# Patient Record
Sex: Male | Born: 2000
Health system: Southern US, Community
[De-identification: ages and names within clinical notes are randomized; demographics above are authoritative.]

## PROBLEM LIST (undated history)

## (undated) DIAGNOSIS — Z8774 Personal history of (corrected) congenital malformations of heart and circulatory system: Secondary | ICD-10-CM

## (undated) DIAGNOSIS — I370 Nonrheumatic pulmonary valve stenosis: Secondary | ICD-10-CM

## (undated) DIAGNOSIS — G935 Compression of brain: Secondary | ICD-10-CM

## (undated) DIAGNOSIS — E230 Hypopituitarism: Secondary | ICD-10-CM

## (undated) HISTORY — PX: CIRCUMCISION: SUR203

## (undated) HISTORY — DX: Hypopituitarism: E23.0

## (undated) HISTORY — DX: Nonrheumatic pulmonary valve stenosis: I37.0

## (undated) HISTORY — DX: Personal history of (corrected) congenital malformations of heart and circulatory system: Z87.74

## (undated) HISTORY — DX: Compression of brain: G93.5

---
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
## 2020-01-13 ENCOUNTER — Ambulatory Visit: Payer: Self-pay | Admitting: Cardiology

## 2020-01-19 NOTE — Progress Notes (Signed)
Earl Hartshorn, MD Reason for referral-pulmonic stenosis, prior ASD closure and PDA closure  HPI: 19 year old male for evaluation of pulmonic stenosis, prior ASD and PDA closure.  Last echocardiogram performed at [facility] in 2017 showed normal LV function, mild pulmonic stenosis, mild pulmonic insufficiency, normal RV size and systolic function.  Patient denies dyspnea, chest pain, palpitations or syncope.  Current Outpatient Medications  Medication Sig Dispense Refill  . fexofenadine (ALLEGRA) 30 MG tablet Take 30 mg by mouth 2 (two) times daily.    Marland Kitchen levothyroxine (SYNTHROID) 25 MCG tablet TAKE 1 TABLET (25 MCG TOTAL) BY MOUTH DAILY. 90 tablet 1   No current facility-administered medications for this visit.    No Known Allergies   Past Medical History:  Diagnosis Date  . [name]-Chiari malformation, type I (H[facility])   . Growth hormone deficiency ([facility])   . Premature infant   . Pulmonary stenosis   . Spontaneous ASD closure   . Spontaneous PDA closure     Past Surgical History:  Procedure Laterality Date  . CIRCUMCISION      Social History   Socioeconomic History  . Marital status: Single    Spouse name: Not on file  . Number of children: Not on file  . Years of education: Not on file  . Highest education level: Not on file  Occupational History  . Not on file  Tobacco Use  . Smoking status: Never Smoker  . Smokeless tobacco: Never Used  Substance and Sexual Activity  . Alcohol use: Not on file  . Drug use: Not on file  . Sexual activity: Not on file  Other Topics Concern  . Not on file  Social History Narrative   Lives with parents, sister and twin brother   Social Determinants of Health   Financial Resource Strain:   . Difficulty of Paying Living Expenses:   Food Insecurity:   . Worried About Programme researcher, broadcasting/film/video in the Last Year:   . Barista in the Last Year:   Transportation Needs:   . Freight forwarder (Medical):   Marland Kitchen Lack of  Transportation (Non-Medical):   Physical Activity:   . Days of Exercise per Week:   . Minutes of Exercise per Session:   Stress:   . Feeling of Stress :   Social Connections:   . Frequency of Communication with Friends and Family:   . Frequency of Social Gatherings with Friends and Family:   . Attends Religious Services:   . Active Member of Clubs or Organizations:   . Attends Banker Meetings:   Marland Kitchen Marital Status:   Intimate Partner Violence:   . Fear of Current or Ex-Partner:   . Emotionally Abused:   Marland Kitchen Physically Abused:   . Sexually Abused:     Family History  Problem Relation Age of Onset  . Hypertension Paternal Grandfather   . Hypertension Maternal Grandmother   . Growth hormone deficiency Brother     ROS: no fevers or chills, productive cough, hemoptysis, dysphasia, odynophagia, melena, hematochezia, dysuria, hematuria, rash, seizure activity, orthopnea, PND, pedal edema, claudication. Remaining systems are negative.  Physical Exam:   Blood pressure 102/74, pulse 86, height 5\' 8"  (1.727 m), weight 145 lb 1.9 oz (65.8 kg), SpO2 99 %.  General:  Well developed/well nourished in NAD Skin warm/dry Patient not depressed No peripheral clubbing Back-normal HEENT-normal/normal eyelids Neck supple/normal carotid upstroke bilaterally; no bruits; no JVD; no thyromegaly chest - CTA/ normal expansion CV - RRR/normal  S1 and S2; 2/6 systolic murmur RSB; no rubs or gallops;  PMI nondisplaced Abdomen -NT/ND, no HSM, no mass, + bowel sounds, no bruit 2+ femoral pulses, no bruits Ext-no edema, chords, 2+ DP Neuro-grossly nonfocal  ECG -rate of 86, nonspecific ST changes. Personally reviewed  A/P  1 history of pulmonic stenosis-we will plan to repeat echocardiogram.  Patient is not having symptoms.  2 ? history of ASD and PDA spontaneous closure-we will repeat echocardiogram.  Olga Millers, MD

## 2020-01-27 ENCOUNTER — Other Ambulatory Visit: Payer: Self-pay

## 2020-01-27 ENCOUNTER — Encounter: Payer: Self-pay | Admitting: Cardiology

## 2020-01-27 ENCOUNTER — Ambulatory Visit (INDEPENDENT_AMBULATORY_CARE_PROVIDER_SITE_OTHER): Payer: 59 | Admitting: Cardiology

## 2020-01-27 VITALS — BP 102/74 | HR 86 | Ht 68.0 in | Wt 145.1 lb

## 2020-01-27 DIAGNOSIS — Q211 Atrial septal defect, unspecified: Secondary | ICD-10-CM

## 2020-01-27 DIAGNOSIS — Q250 Patent ductus arteriosus: Secondary | ICD-10-CM

## 2020-01-27 DIAGNOSIS — Q223 Other congenital malformations of pulmonary valve: Secondary | ICD-10-CM

## 2020-01-27 NOTE — Patient Instructions (Signed)
Medication Instructions:   NO CHANGE  *If you need a refill on your cardiac medications before your next appointment, please call your pharmacy*   Lab Work: If you have labs (blood work) drawn today and your tests are completely normal, you will receive your results only by: Marland Kitchen MyChart Message (if you have MyChart) OR . A paper copy in the mail If you have any lab test that is abnormal or we need to change your treatment, we will call you to review the results.   TESTING:  Your physician has requested that you have an echocardiogram. Echocardiography is a painless test that uses sound waves to create images of your heart. It provides your doctor with information about the size and shape of your heart and how well your heart's chambers and valves are working. This procedure takes approximately one hour. There are no restrictions for this procedure.[address]   Follow-Up: At [facility], you and your health needs are our priority.  As part of our continuing mission to provide you with exceptional heart care, we have created designated Provider Care Teams.  These Care Teams include your primary Cardiologist (physician) and Advanced Practice Providers (APPs -  Physician Assistants and Nurse Practitioners) who all work together to provide you with the care you need, when you need it.  We recommend signing up for the patient portal called "MyChart".  Sign up information is provided on this After Visit Summary.  MyChart is used to connect with patients for Virtual Visits (Telemedicine).  Patients are able to view lab/test results, encounter notes, upcoming appointments, etc.  Non-urgent messages can be sent to your provider as well.   To learn more about what you can do with MyChart, go to [email or web address].    Your next appointment:   12 month(s)  The format for your next appointment:   In Person  Provider:   Olga Millers, MD

## 2020-02-02 NOTE — Addendum Note (Signed)
Addended by: Myna Hidalgo A on: 02/02/2020 12:53 PM   Modules accepted: Orders

## 2020-02-15 ENCOUNTER — Telehealth (INDEPENDENT_AMBULATORY_CARE_PROVIDER_SITE_OTHER): Payer: Self-pay | Admitting: Family

## 2020-02-15 ENCOUNTER — Telehealth (HOSPITAL_COMMUNITY): Payer: Self-pay | Admitting: Cardiology

## 2020-02-15 ENCOUNTER — Ambulatory Visit: Payer: Self-pay | Admitting: Cardiology

## 2020-02-15 ENCOUNTER — Other Ambulatory Visit (HOSPITAL_COMMUNITY): Payer: 59

## 2020-02-15 DIAGNOSIS — E039 Hypothyroidism, unspecified: Secondary | ICD-10-CM

## 2020-02-15 NOTE — Telephone Encounter (Signed)
Who's calling (name and relationship to patient) : °Leslie (mom) ° °Best contact number: °[phone] ° °Provider they see: °Spenser Beasley ° °Reason for call: °Patient is scheduled to be seen next Monday, 8/2 & mom would like orders for lab work to be sent to [facility] in [place] so labs can be completed prior to appointment. ° ° ° °PRESCRIPTION REFILL ONLY ° °Name of prescription: ° °Pharmacy: ° °

## 2020-02-15 NOTE — Telephone Encounter (Signed)
Per Order Dr Jens Som wanted Erie Noe to do echocardiogram and appointment was made on her day off. I called patient and Left Voicemail that appt was cancelled for 02/15/2020 and we would call him on 02/16/2020 with another date and time when Erie Noe can scan patient.

## 2020-02-15 NOTE — Telephone Encounter (Signed)
Spoke with patient and let him know labs were sent to [facility] as requested.

## 2020-02-18 DIAGNOSIS — E039 Hypothyroidism, unspecified: Secondary | ICD-10-CM | POA: Diagnosis not present

## 2020-02-19 LAB — T4, FREE: Free T4: 1.43 ng/dL (ref 0.93–1.60)

## 2020-02-19 LAB — TSH: TSH: 4.29 u[IU]/mL (ref 0.450–4.500)

## 2020-02-19 LAB — T4: T4, Total: 6.5 ug/dL (ref 4.5–12.0)

## 2020-02-22 ENCOUNTER — Encounter (INDEPENDENT_AMBULATORY_CARE_PROVIDER_SITE_OTHER): Payer: Self-pay | Admitting: Family

## 2020-02-22 ENCOUNTER — Ambulatory Visit (INDEPENDENT_AMBULATORY_CARE_PROVIDER_SITE_OTHER): Payer: 59 | Admitting: Family

## 2020-02-22 ENCOUNTER — Other Ambulatory Visit: Payer: Self-pay

## 2020-02-22 VITALS — BP 116/68 | HR 72 | Wt 148.8 lb

## 2020-02-22 DIAGNOSIS — E039 Hypothyroidism, unspecified: Secondary | ICD-10-CM | POA: Diagnosis not present

## 2020-02-22 NOTE — Progress Notes (Signed)
Subjective:  Subjective   Patient Name: Earl [name] Date of Birth: March 24, 2001  MRN: [id number]  Earl [name]  presents to the office today for follow-up evaluation and management of his growth hormone deficiency treated with growth hormone  HISTORY OF PRESENT ILLNESS:   Earl [name] is a 19 y.o. Caucasian male   Stefon was accompanied by his dad and brother    1. 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 His initial visit was 02/21/05. His height was <1 %ile with weight at 1st %ile. IGF-1 was 16 ng/dL (58-592) and IGF-BP3 was 1.5 mg/L (0.8-3.0). 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 He was started on [facility] 03/2006. He was started on Norditropin with good results.  He has tolerated it well. He has been being followed at [facility]. His family is transitioning care to our clinic today. His last visit at [facility] was in May 2014. At that time he was receiving Norditropin 1.5 mg daily 6 days per week (0.24 mg/kg/week).    2. The patient's last [facility] visit was on 08/2019 . In the interim, he has been generally healthy.    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 He is mainly playing golf for activity. Taking 25 mcg of levothyroxine per day, rarely misses a dose. Denies fatigue, constipation and cold intolerance.   3. Pertinent Review of Systems:  All systems reviewed with pertinent positives listed below; otherwise negative. Constitutional: Sleeping well. Good energy and appetite. Weight stable.  Eyes: no blurry vision, no changes in vision.  HENT: No neck pain, no trouble swallowing.  Respiratory: No increased work of breathing. No SOB  Cardiac: No palpitations. No chest pain  GI: No constipation or diarrhea GU: No polyuria or nocturia Musculoskeletal: No joint deformity Neuro: Normal affect. No tremors or motor changes.  Endocrine: As above     PAST MEDICAL, FAMILY, AND SOCIAL HISTORY  Past  Medical History:  Diagnosis Date  . [name]-Chiari malformation, type I ([facility])   . Growth hormone deficiency ([facility])   . Premature infant   . Pulmonary stenosis   . Spontaneous ASD closure   . Spontaneous PDA closure     Family History  Problem Relation Age of Onset  . Hypertension Paternal Grandfather   . Hypertension Maternal Grandmother   . Growth hormone deficiency Brother      Current Outpatient Medications:  .  fexofenadine (ALLEGRA) 30 MG tablet, Take 30 mg by mouth 2 (two) times daily., Disp: , Rfl:  .  levothyroxine (SYNTHROID) 25 MCG tablet, TAKE 1 TABLET (25 MCG TOTAL) BY MOUTH DAILY., Disp: 90 tablet, Rfl: 1  Allergies as of 02/22/2020  . (No Known Allergies)     reports that he has never smoked. He has never used smokeless tobacco. Pediatric History  Patient Parents  . Earl [name] (Mother)  . Earl [name] (Father)   Other Topics Concern  . Not on file  Social History Narrative   Lives with parents, sister and twin brother   Finished indoor track. Now wants to play golf.    Primary Care Provider: [name], MD  ROS: There are no other significant problems involving Earl [name]'s other body systems.    Objective:  Objective  Vital Signs:  BP 116/68   Pulse 72   Wt 148 lb 12.8 oz (67.5 kg)   BMI 22.62 kg/m   Blood pressure percentiles are not available for patients who are 18 years or older.  Ht Readings from Last 3 Encounters:  01/27/20 5\' 8"  (1.727 m) (29 %, Z= -0.55)*  07/14/18 5' 7.52" (1.715 m) (27 %, Z= -0.62)*  03/11/18 5' 7.48" (1.714 m) (28 %, Z= -0.59)*   * Growth percentiles are based on CDC (Boys, 2-20 Years) data.   Wt Readings from Last 3 Encounters:  02/22/20 148 lb 12.8 oz (67.5 kg) (42 %, Z= -0.20)*  01/27/20 145 lb 1.9 oz (65.8 kg) (36 %, Z= -0.35)*  07/14/18 132 lb 3.2 oz (60 kg) (25 %, Z= -0.68)*   * Growth percentiles are based on CDC (Boys, 2-20 Years) data.   HC Readings from Last 3 Encounters:  No data found for  [facility]   Body surface area is 1.8 meters squared. No height on file for this encounter. 42 %ile (Z= -0.20) based on CDC (Boys, 2-20 Years) weight-for-age data using vitals from 02/22/2020.    PHYSICAL EXAM:  General: Well developed, well nourished male in no acute distress.   Head: Normocephalic, atraumatic.   Eyes:  Pupils equal and round. EOMI.  Sclera white.  No eye drainage.   Ears/Nose/Mouth/Throat: Nares patent, no nasal drainage.  Normal dentition, mucous membranes moist.  Neck: supple, no cervical lymphadenopathy, no thyromegaly Cardiovascular: regular rate, normal S1/S2, no murmurs Respiratory: No increased work of breathing.  Lungs clear to auscultation bilaterally.  No wheezes. Abdomen: soft, nontender, nondistended. Normal bowel sounds.  No appreciable masses  Extremities: warm, well perfused, cap refill < 2 sec.   Musculoskeletal: Normal muscle mass.  Normal strength Skin: warm, dry.  No rash or lesions. Neurologic: alert and oriented, normal speech, no tremor  LAB DATA:         Assessment and Plan:  Assessment  ASSESSMENT: Jatavis is a 19 y.o. Caucasian twin with growth hormone deficiency, he was on growth hormone supplement until 02/2017. 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   He is clinically and biochemically euthyroid on 25 mcg of levothyroxine per day.   PLAN:  1. Diagnostic: Reviewed labs  2. Therapeutic: Take 25 mcg of levothyroxine per day  3. Patient education: Discussed all of the above in detail.  4. Follow-up: 6 months       03/2017,  [facility]  Pediatric Specialist  [address]  [place], [place]  Tele: [phone]

## 2020-02-23 ENCOUNTER — Other Ambulatory Visit (INDEPENDENT_AMBULATORY_CARE_PROVIDER_SITE_OTHER): Payer: Self-pay | Admitting: Family

## 2020-02-23 DIAGNOSIS — E039 Hypothyroidism, unspecified: Secondary | ICD-10-CM

## 2020-02-23 MED FILL — LEVOTHYROXINE SODIUM 25 MCG: 25 | 90 days supply | Qty: 90 | Fill #0

## 2020-02-25 ENCOUNTER — Ambulatory Visit (INDEPENDENT_AMBULATORY_CARE_PROVIDER_SITE_OTHER): Payer: 59 | Admitting: Family

## 2020-02-26 ENCOUNTER — Other Ambulatory Visit: Payer: Self-pay

## 2020-02-26 ENCOUNTER — Ambulatory Visit (HOSPITAL_COMMUNITY): Payer: 59 | Attending: Cardiovascular Disease

## 2020-02-26 DIAGNOSIS — Q223 Other congenital malformations of pulmonary valve: Secondary | ICD-10-CM | POA: Diagnosis not present

## 2020-02-26 LAB — ECHOCARDIOGRAM COMPLETE
Area-P 1/2: 3.48 cm2
S' Lateral: 3.1 cm

## 2020-02-29 ENCOUNTER — Other Ambulatory Visit (HOSPITAL_COMMUNITY): Payer: Self-pay

## 2020-04-25 DIAGNOSIS — Z20822 Contact with and (suspected) exposure to covid-19: Secondary | ICD-10-CM | POA: Diagnosis not present

## 2020-04-25 DIAGNOSIS — J3489 Other specified disorders of nose and nasal sinuses: Secondary | ICD-10-CM | POA: Diagnosis not present

## 2020-04-25 DIAGNOSIS — J069 Acute upper respiratory infection, unspecified: Secondary | ICD-10-CM | POA: Diagnosis not present

## 2020-04-25 DIAGNOSIS — R059 Cough, unspecified: Secondary | ICD-10-CM | POA: Diagnosis not present

## 2020-04-25 DIAGNOSIS — R0989 Other specified symptoms and signs involving the circulatory and respiratory systems: Secondary | ICD-10-CM | POA: Diagnosis not present

## 2020-04-25 DIAGNOSIS — R0600 Dyspnea, unspecified: Secondary | ICD-10-CM | POA: Diagnosis not present

## 2020-04-25 DIAGNOSIS — R0981 Nasal congestion: Secondary | ICD-10-CM | POA: Diagnosis not present

## 2020-04-25 DIAGNOSIS — R067 Sneezing: Secondary | ICD-10-CM | POA: Diagnosis not present

## 2020-05-25 MED FILL — LEVOTHYROXINE SODIUM 25 MCG: 25 | 90 days supply | Qty: 90 | Fill #1

## 2020-08-24 ENCOUNTER — Other Ambulatory Visit (INDEPENDENT_AMBULATORY_CARE_PROVIDER_SITE_OTHER): Payer: Self-pay | Admitting: Family

## 2020-08-24 DIAGNOSIS — E039 Hypothyroidism, unspecified: Secondary | ICD-10-CM

## 2020-08-24 MED FILL — LEVOTHYROXINE SODIUM 25 MCG: 25 | 90 days supply | Qty: 90 | Fill #0

## 2020-08-29 ENCOUNTER — Telehealth (INDEPENDENT_AMBULATORY_CARE_PROVIDER_SITE_OTHER): Payer: 59 | Admitting: Family

## 2020-08-29 ENCOUNTER — Other Ambulatory Visit: Payer: Self-pay

## 2020-08-29 ENCOUNTER — Encounter (INDEPENDENT_AMBULATORY_CARE_PROVIDER_SITE_OTHER): Payer: Self-pay | Admitting: Family

## 2020-08-29 DIAGNOSIS — E039 Hypothyroidism, unspecified: Secondary | ICD-10-CM | POA: Diagnosis not present

## 2020-08-29 NOTE — Progress Notes (Signed)
This is a Pediatric Specialist E-Visit follow up consult provided via MyChart video chat Earl Earl [name] consented to an E-Visit consult today.  Location of patient: Earl Earl [name] is at school. Location of provider: Gretchen Short ,NP is at office Patient was referred by Earl Lopes, MD   The following participants were involved in this E-Visit: [name] (patient), [name] CMA, [name] , NP(list of participants and their roles)  Chief Complain/ Reason for E-Visit today: Hypothyroidism  Total time on call: >20 spent today reviewing the medical chart, counseling the patient/family, and documenting today's visit.   Follow up: 6 months.     Subjective:  Subjective   Patient Name: Earl Earl [name] Date of Birth: 11-04-2000  MRN: [id number]  [name]  presents to the office today for follow-up evaluation and management of his growth hormone deficiency treated with growth hormone  HISTORY OF PRESENT ILLNESS:   Earl Earl [name] is a 20 y.o. Caucasian male   Earl [name] was accompanied by his dad and brother    1. 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 His initial visit was 02/21/05. His height was <1 %ile with weight at 1st %ile. IGF-1 was 16 ng/dL (91-638) and IGF-BP3 was 1.5 mg/L (0.8-3.0). 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 He was started on [facility] 03/2006. He was started on Norditropin with good results.  He has tolerated it well. He has been being followed at [facility]. His family is transitioning care to our clinic today. His last visit at [facility] was in May 2014. At that time he was receiving Norditropin 1.5 mg daily 6 days per week (0.24 mg/kg/week).    2. The patient's last [facility] visit was on 01/2020 . In the interim, he has been generally healthy.    [name] well in school at [employer]. He is currently playing on the club volleyball team for activity. He is taking 25 mcg of levothyroxine per day, denies missed doses.  Denies fatigue, constipation and cold intolerance.    3. Pertinent Review of Systems:  All systems reviewed with pertinent positives listed below; otherwise negative. Constitutional: Sleeping well. Good energy and appetite. Weight stable.  Eyes: no blurry vision, no changes in vision.  HENT: No neck pain, no trouble swallowing.  Respiratory: No increased work of breathing. No SOB  Cardiac: No palpitations. No chest pain  GI: No constipation or diarrhea GU: No polyuria or nocturia Musculoskeletal: No joint deformity Neuro: Normal affect. No tremors or motor changes.  Endocrine: As above     PAST MEDICAL, FAMILY, AND SOCIAL HISTORY  Past Medical History:  Diagnosis Date  . Earl [name]-Chiari malformation, type I (HCC)   . Growth hormone deficiency (HCC)   . Premature infant   . Pulmonary stenosis   . Spontaneous ASD closure   . Spontaneous PDA closure     Family History  Problem Relation Age of Onset  . Hypertension Paternal Grandfather   . Hypertension Maternal Grandmother   . Growth hormone deficiency Brother      Current Outpatient Medications:  .  fexofenadine (ALLEGRA) 30 MG tablet, Take 30 mg by mouth 2 (two) times daily., Disp: , Rfl:  .  levothyroxine (SYNTHROID) 25 MCG tablet, TAKE 1 TABLET BY MOUTH ONCE DAILY, Disp: 90 tablet, Rfl: 1  Allergies as of 08/29/2020  . (No Known Allergies)     reports that he has never smoked. He has never used smokeless tobacco. Pediatric History  Patient Parents  . Brunet,Leslie (Mother)  .  Sligh,Erik (Father)   Other Topics Concern  . Not on file  Social History Narrative   Lives with parents, sister and twin brother   Finished indoor track. Now wants to play golf.    Primary Care Provider: [name], MD  ROS: There are no other significant problems involving Earl [name]'s other body systems.    Objective:  Objective  Vital Signs:  There were no vitals taken for this visit.  Blood pressure percentiles are not  available for patients who are 18 years or older.  Ht Readings from Last 3 Encounters:  01/27/20 5\' 8"  (1.727 m) (29 %, Z= -0.55)*  07/14/18 5' 7.52" (1.715 m) (27 %, Z= -0.62)*  03/11/18 5' 7.48" (1.714 m) (28 %, Z= -0.59)*   * Growth percentiles are based on CDC (Boys, 2-20 Years) data.   Wt Readings from Last 3 Encounters:  02/22/20 148 lb 12.8 oz (67.5 kg) (42 %, Z= -0.20)*  01/27/20 145 lb 1.9 oz (65.8 kg) (36 %, Z= -0.35)*  07/14/18 132 lb 3.2 oz (60 kg) (25 %, Z= -0.68)*   * Growth percentiles are based on CDC (Boys, 2-20 Years) data.   HC Readings from Last 3 Encounters:  No data found for [facility]   There is no height or weight on file to calculate BSA. No height on file for this encounter. No weight on file for this encounter.    PHYSICAL EXAM: General: Well developed, well nourished male in no acute distress.   Head: Normocephalic, atraumatic.   Eyes:  Pupils equal and round. EOMI.  Sclera white.  No eye drainage.   Cardiovascular: No cyanosis.  Respiratory: No increased work of breathing. Skin: warm, dry.  No rash or lesions. Neurologic: alert and oriented, normal speech, no tremor   LAB DATA:         Assessment and Plan:  Assessment  ASSESSMENT: Eros is a 20 y.o. Caucasian twin with growth hormone deficiency and has completed linear growth. He also has hypothyroidism on daily levothyroxine.   He is clinically euthyroid on 25 mcg of levothyroxine per day.   PLAN:  1. Diagnostic: TSH, FT4 and T4 ordered   2. Therapeutic: Take 25 mcg of levothyroxine per day  3. Patient education: Reviewed s/s of hypothyroidism. Advised to take medication on empty stomach each morning. Answered questions.  4. Follow-up: 6 months       12,  [facility]  Pediatric Specialist  [address]  [place], [place]  Tele: [phone]

## 2020-09-02 DIAGNOSIS — F5101 Primary insomnia: Secondary | ICD-10-CM | POA: Diagnosis not present

## 2020-09-02 DIAGNOSIS — Z13220 Encounter for screening for lipoid disorders: Secondary | ICD-10-CM | POA: Diagnosis not present

## 2020-09-02 DIAGNOSIS — I370 Nonrheumatic pulmonary valve stenosis: Secondary | ICD-10-CM | POA: Diagnosis not present

## 2020-09-02 DIAGNOSIS — Z0000 Encounter for general adult medical examination without abnormal findings: Secondary | ICD-10-CM | POA: Diagnosis not present

## 2020-09-02 DIAGNOSIS — E039 Hypothyroidism, unspecified: Secondary | ICD-10-CM | POA: Diagnosis not present

## 2020-11-27 MED FILL — Levothyroxine Sodium Tab 25 MCG: ORAL | 90 days supply | Qty: 90 | Fill #0 | Status: AC

## 2020-11-28 ENCOUNTER — Other Ambulatory Visit (HOSPITAL_BASED_OUTPATIENT_CLINIC_OR_DEPARTMENT_OTHER): Payer: Self-pay

## 2021-01-31 DIAGNOSIS — H52223 Regular astigmatism, bilateral: Secondary | ICD-10-CM | POA: Diagnosis not present

## 2021-01-31 DIAGNOSIS — H5213 Myopia, bilateral: Secondary | ICD-10-CM | POA: Diagnosis not present

## 2021-02-20 ENCOUNTER — Other Ambulatory Visit: Payer: Self-pay

## 2021-02-20 ENCOUNTER — Ambulatory Visit (INDEPENDENT_AMBULATORY_CARE_PROVIDER_SITE_OTHER): Payer: 59 | Admitting: Family

## 2021-02-20 ENCOUNTER — Encounter (INDEPENDENT_AMBULATORY_CARE_PROVIDER_SITE_OTHER): Payer: Self-pay | Admitting: Family

## 2021-02-20 VITALS — BP 118/76 | HR 80 | Wt 152.6 lb

## 2021-02-20 DIAGNOSIS — E039 Hypothyroidism, unspecified: Secondary | ICD-10-CM | POA: Diagnosis not present

## 2021-02-20 DIAGNOSIS — F5101 Primary insomnia: Secondary | ICD-10-CM | POA: Insufficient documentation

## 2021-02-20 NOTE — Patient Instructions (Signed)
-  Take your medication at the same time every day -Try to take it on an empty stomach -If you forget to take a dose, take it as soon as you remember.  If you don't remember until the next day, take 2 doses then.  NEVER take more than 2 doses at a time. -Use a pill box to help make it easier to keep track of doses   

## 2021-02-20 NOTE — Progress Notes (Signed)
Subjective:  Subjective   Patient Name: Earl Earl Date of Birth: [birth year]  MRN: [id number]  Earl Earl  presents to the office today for follow-up evaluation and management of his growth hormone deficiency treated with growth hormone  HISTORY OF PRESENT ILLNESS:   Earl Earl is a 20 y.o. Caucasian male   Earl Earl was accompanied by his dad and brother    1. 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 His initial visit was 02/21/05. His height was <1 %ile with weight at 1st %ile. IGF-1 was 16 ng/dL (58-850) and IGF-BP3 was 1.5 mg/L (0.8-3.0). 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 He was started on [facility] 03/2006. He was started on Norditropin with good results.  He has tolerated it well. He has been being followed at [facility]. His family is transitioning care to our clinic today. His last visit at [facility] was in May 2014. At that time he was receiving Norditropin 1.5 mg daily 6 days per week (0.24 mg/kg/week).    2. The patient's last [facility] visit was on 01/2020 . In the interim, he has been generally healthy.    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 Will start back to school in August and will live in apartment. He is taking 25 mcg of levothyroxine per day, no missed doses.    3. Pertinent Review of Systems:  All systems reviewed with pertinent positives listed below; otherwise negative. Constitutional: Sleeping well and weight stable.  Eyes: no blurry vision, no changes in vision.  HENT: No neck pain, no trouble swallowing.  Respiratory: No increased work of breathing. No SOB  Cardiac: No palpitations. No chest pain  GI: No constipation or diarrhea GU: No polyuria or nocturia Musculoskeletal: No joint deformity Neuro: Normal affect. No tremors or motor changes.  Endocrine: As above     PAST MEDICAL, FAMILY, AND SOCIAL HISTORY  Past Medical History:  Diagnosis Date    Earl Earl, Earl I (HCC)    Growth hormone deficiency (HCC)    Premature infant    Pulmonary stenosis    Spontaneous ASD closure    Spontaneous PDA closure     Family History  Problem Relation Age of Onset   Hypertension Paternal Grandfather    Hypertension Maternal Grandmother    Growth hormone deficiency Brother      Current Outpatient Medications:    fexofenadine (ALLEGRA) 30 MG tablet, Take 30 mg by mouth 2 (two) times daily., Disp: , Rfl:    levothyroxine (SYNTHROID) 25 MCG tablet, TAKE 1 TABLET BY MOUTH ONCE DAILY, Disp: 90 tablet, Rfl: 1  Allergies as of 02/20/2021   (No Known Allergies)     reports that he has never smoked. He has never used smokeless tobacco. Pediatric History  Patient Parents   Fung,Leslie (Mother)   Andel,Erik (Father)   Other Topics Concern   Not on file  Social History Narrative   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    Finished indoor track. Now wants to play golf.    Primary Care Provider: Daisy Floro, MD  ROS: There are no other significant problems involving Earl Earl other body systems.    Objective:  Objective  Vital Signs:  BP 118/76   Pulse 80   Wt 152 lb 9.6 oz (69.2 kg)   BMI 23.20 kg/m   Growth percentile SmartLinks can only be used for patients less than 70 years old.  Ht  Readings from Last 3 Encounters:  01/27/20 5\' 8"  (1.727 m) (29 %, Z= -0.55)*  07/14/18 5' 7.52" (1.715 m) (27 %, Z= -0.62)*  03/11/18 5' 7.48" (1.714 m) (28 %, Z= -0.59)*   * Growth percentiles are based on CDC (Boys, 2-20 Years) data.   Wt Readings from Last 3 Encounters:  02/20/21 152 lb 9.6 oz (69.2 kg)  02/22/20 148 lb 12.8 oz (67.5 kg) (42 %, Z= -0.20)*  01/27/20 145 lb 1.9 oz (65.8 kg) (36 %, Z= -0.35)*   * Growth percentiles are based on CDC (Boys, 2-20 Years) data.   HC Readings from Last 3 Encounters:  No data found for [facility]   Body surface area is 1.82 meters squared. Facility age limit for  growth percentiles is 20 years. Facility age limit for growth percentiles is 20 years.    PHYSICAL EXAM: General: Well developed, well nourished male in no acute distress.   Head: Normocephalic, atraumatic.   Eyes:  Pupils equal and round. EOMI.  Sclera white.  No eye drainage.   Ears/Nose/Mouth/Throat: Nares patent, no nasal drainage.  Normal dentition, mucous membranes moist.  Neck: supple, no cervical lymphadenopathy, no thyromegaly Cardiovascular: regular rate, normal S1/S2, no murmurs Respiratory: No increased work of breathing.  Lungs clear to auscultation bilaterally.  No wheezes. Abdomen: soft, nontender, nondistended. Normal bowel sounds.  No appreciable masses  Extremities: warm, well perfused, cap refill < 2 sec.   Musculoskeletal: Normal muscle mass.  Normal strength Skin: warm, dry.  No rash or lesions. Neurologic: alert and oriented, normal speech, no tremor    LAB DATA:         Assessment and Plan:  Assessment  ASSESSMENT: [name] is a 20 y.o. Caucasian twin with growth hormone deficiency, has completed linear growth. He also has autoimmune hypothyroidism. He is clinically euthyroid on 25 mcg of levothyroxine per day.   PLAN:  1. Diagnostic: TSH, FT4 and T4 ordered to [facility] per patient request.  2. Therapeutic: Take 25 mcg of levothyroxine per day  3. Patient education:Reviewed and discussed all of the above in detail.   4. Follow-up: 6 months     LOS: >30  spent today reviewing the medical chart, counseling the patient/family, and documenting today's visit.    26,  FNP-C  Pediatric Specialist  [address]  [place], [place]  Tele: [phone]

## 2021-02-21 ENCOUNTER — Other Ambulatory Visit (HOSPITAL_BASED_OUTPATIENT_CLINIC_OR_DEPARTMENT_OTHER): Payer: Self-pay

## 2021-02-21 DIAGNOSIS — E039 Hypothyroidism, unspecified: Secondary | ICD-10-CM | POA: Diagnosis not present

## 2021-02-22 LAB — T4, FREE: Free T4: 1.7 ng/dL (ref 0.82–1.77)

## 2021-02-22 LAB — TSH: TSH: 3.5 u[IU]/mL (ref 0.450–4.500)

## 2021-02-22 LAB — T4: T4, Total: 8.4 ug/dL (ref 4.5–12.0)

## 2021-02-26 ENCOUNTER — Other Ambulatory Visit (INDEPENDENT_AMBULATORY_CARE_PROVIDER_SITE_OTHER): Payer: Self-pay | Admitting: Family

## 2021-02-26 DIAGNOSIS — E039 Hypothyroidism, unspecified: Secondary | ICD-10-CM

## 2021-02-27 ENCOUNTER — Other Ambulatory Visit (HOSPITAL_BASED_OUTPATIENT_CLINIC_OR_DEPARTMENT_OTHER): Payer: Self-pay

## 2021-02-27 MED ORDER — LEVOTHYROXINE SODIUM 25 MCG PO TABS
ORAL_TABLET | Freq: Every day | ORAL | 1 refills | Status: DC
Start: 2021-02-27 — End: 2021-08-24
  Filled 2021-02-27 – 2021-05-18 (×2): qty 90, 90d supply, fill #0

## 2021-05-01 ENCOUNTER — Other Ambulatory Visit: Payer: Self-pay

## 2021-05-01 ENCOUNTER — Ambulatory Visit (INDEPENDENT_AMBULATORY_CARE_PROVIDER_SITE_OTHER): Payer: 59 | Admitting: Family

## 2021-05-01 ENCOUNTER — Encounter (HOSPITAL_BASED_OUTPATIENT_CLINIC_OR_DEPARTMENT_OTHER): Payer: Self-pay | Admitting: Family

## 2021-05-01 VITALS — BP 126/80 | HR 95 | Ht 68.0 in | Wt 156.0 lb

## 2021-05-01 DIAGNOSIS — Q250 Patent ductus arteriosus: Secondary | ICD-10-CM | POA: Diagnosis not present

## 2021-05-01 DIAGNOSIS — Q223 Other congenital malformations of pulmonary valve: Secondary | ICD-10-CM

## 2021-05-01 DIAGNOSIS — Q211 Atrial septal defect, unspecified: Secondary | ICD-10-CM | POA: Diagnosis not present

## 2021-05-01 NOTE — Patient Instructions (Addendum)
Medication Instructions:  Continue your current medications.   *If you need a refill on your cardiac medications before your next appointment, please call your pharmacy*  Lab Work: None ordered today.   Testing/Procedures: Your EKG today showed normal sinus rhythm with sinus arrhythmia. This is very common finding.   Follow-Up: At [facility], you and your health needs are our priority.  As part of our continuing mission to provide you with exceptional heart care, we have created designated Provider Care Teams.  These Care Teams include your primary Cardiologist (physician) and Advanced Practice Providers (APPs -  Physician Assistants and Nurse Practitioners) who all work together to provide you with the care you need, when you need it.  We recommend signing up for the patient portal called "MyChart".  Sign up information is provided on this After Visit Summary.  MyChart is used to connect with patients for Virtual Visits (Telemedicine).  Patients are able to view lab/test results, encounter notes, upcoming appointments, etc.  Non-urgent messages can be sent to your provider as well.   To learn more about what you can do with MyChart, go to [email or web address].    Your next appointment:   1 year(s)  The format for your next appointment:   In Person  Provider:   You may see Olga Millers, MD or one of the following Advanced Practice Providers on your designated Care Team:   Marjie Skiff, PA-C Edd Fabian, FNP Alver Sorrow, NP   Other Instructions  Heart Healthy Diet Recommendations: A low-salt diet is recommended. Meats should be grilled, baked, or boiled. Avoid fried foods. Focus on lean protein sources like fish or chicken with vegetables and fruits. The American Heart Association is a Chief Technology Officer!    Exercise recommendations: The American Heart Association recommends 150 minutes of moderate intensity exercise weekly. Try 30 minutes of moderate intensity  exercise 4-5 times per week. This could include walking, jogging, or swimming.

## 2021-05-01 NOTE — Progress Notes (Signed)
Office Visit    Patient Name: Earl [name] Date of Encounter: 05/01/2021  PCP:  Daisy Floro, MD   [facility]  Cardiologist:  Olga Millers, MD  Advanced Practice Provider:  No care team member to display Electrophysiologist:  None     Chief Complaint    Earl [name] is a 20 y.o. male with a hx of hypothyroidism, pulmonic stenosis, prior ASD closure and PDA closure presents today for follow up of pulmonic stenosis   Past Medical History    Past Medical History:  Diagnosis Date   [name]-Chiari malformation, type I (HCC)    Growth hormone deficiency (HCC)    Premature infant    Pulmonary stenosis    Spontaneous ASD closure    Spontaneous PDA closure    Past Surgical History:  Procedure Laterality Date   CIRCUMCISION      Allergies  No Known Allergies  History of Present Illness    Earl [name] is a 20 y.o. male with a hx of hypothyroidism,  hypothyroidism, pulmonic stenosis, prior ASD closure and PDA closure last seen 01/2020 by Dr. Jens Som.  He established with Dr. Jens Som last year due to history of prior ASD and PDA closure with pulmonic stenosis.  Echocardiogram at [facility] in 2017 with normal LV function, mild pulmonic stenosis, mild pulmonic insufficiency, RV normal size and systolic function.  He was doing well and recommended for updated echocardiogram.  Echocardiogram 02/26/2020 with no residual ASD or PDA, normal LVEF 55%, RV normal size and function, normal PASP, trivial MR, no significant pulmonic stenosis (mean gradient 8 mmHg, peak velocity 1.43m/s).   He presents today for follow-up.  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 He plays volleyball for [employer] which he enjoys. Reports no shortness of breath nor dyspnea on exertion. Reports no chest pain, pressure, or tightness. No edema, orthopnea, PND. Reports no palpitations.    EKGs/Labs/Other Studies  Reviewed:   The following studies were reviewed today:  Echo 02/26/20  1. No residual ASD or PDA noted.   2. Left ventricular ejection fraction, by estimation, is 55%. The left  ventricle has low normal function. The left ventricle has no regional wall  motion abnormalities. Left ventricular diastolic parameters were normal.  The average left ventricular  global longitudinal strain is -21.1 %. The global longitudinal strain is  normal.   3. Right ventricular systolic function is normal. The right ventricular  size is normal. There is normal pulmonary artery systolic pressure.   4. The mitral valve is normal in structure. Trivial mitral valve  regurgitation. No evidence of mitral stenosis.   5. The aortic valve is tricuspid. Aortic valve regurgitation is not  visualized. No aortic stenosis is present.   6. Pulmonic valve peak velocity 1.89 m/s. Peak gradient 14 mmHg. Mean  gradient 8 mmHg. This is lower than the gradients on prior echo 02/2016  (peak velocity 2.36 m/s, peak gradient 22 mmHg, mean gradient 15 mmHg).  Current gradients are normal.   7. The inferior vena cava is normal in size with greater than 50%  respiratory variability, suggesting right atrial pressure of 3 mmHg.   EKG:  EKG is ordered today.  The ekg ordered today demonstrates NSR 95 bpm with sinus arrhythmia and rightward axis. No acute St/T wave changes.   Recent Labs: 02/21/2021: TSH 3.500  Recent Lipid Panel No results found for: CHOL, TRIG, HDL, CHOLHDL, VLDL, LDLCALC, LDLDIRECT  Home Medications   Current Meds  Medication Sig   fexofenadine (ALLEGRA) 30 MG tablet Take 30 mg by mouth 2 (two) times daily.   levothyroxine (SYNTHROID) 25 MCG tablet TAKE 1 TABLET BY MOUTH ONCE DAILY     Review of Systems      All other systems reviewed and are otherwise negative except as noted above.  Physical Exam    VS:  BP 126/80   Pulse 95   Ht 5\' 8"  (1.727 m)   Wt 156 lb (70.8 kg)   SpO2 98%   BMI 23.72 kg/m   , BMI Body mass index is 23.72 kg/m.  Wt Readings from Last 3 Encounters:  05/01/21 156 lb (70.8 kg)  02/20/21 152 lb 9.6 oz (69.2 kg)  02/22/20 148 lb 12.8 oz (67.5 kg) (42 %, Z= -0.20)*   * Growth percentiles are based on CDC (Boys, 2-20 Years) data.    GEN: Well nourished, well developed, in no acute distress. HEENT: normal. Neck: Supple, no JVD, carotid bruits, or masses. Cardiac: RRR, no  rubs, or gallops. Gr 2/6 systolic murmur RSB, No clubbing, cyanosis, edema.  Radials/PT 2+ and equal bilaterally.  Respiratory:  Respirations regular and unlabored, clear to auscultation bilaterally. GI: Soft, nontender, nondistended. MS: No deformity or atrophy. Skin: Warm and dry, no rash. Neuro:  Strength and sensation are intact. Psych: Normal affect.  Assessment & Plan    Pulmonic stenosis - No significant stenosis by echo 02/2020. No shortness of breath, chest pain, syncope. Very active 03/2020. Consider repeat echo in 1-3 years for monitoring or if new symptoms.   Prior ASD and PDA closure - Closed by echo 02/2020.  Disposition: Follow up in 1 year(s) with Dr. 03/2020 or APP.  Signed, Jens Som, NP 05/01/2021, 4:50 PM [facility]

## 2021-05-18 ENCOUNTER — Other Ambulatory Visit (HOSPITAL_BASED_OUTPATIENT_CLINIC_OR_DEPARTMENT_OTHER): Payer: Self-pay

## 2021-05-18 ENCOUNTER — Other Ambulatory Visit (HOSPITAL_COMMUNITY): Payer: Self-pay

## 2021-05-24 ENCOUNTER — Other Ambulatory Visit (HOSPITAL_COMMUNITY): Payer: Self-pay

## 2021-08-24 ENCOUNTER — Other Ambulatory Visit (HOSPITAL_COMMUNITY): Payer: Self-pay

## 2021-08-24 ENCOUNTER — Ambulatory Visit (INDEPENDENT_AMBULATORY_CARE_PROVIDER_SITE_OTHER): Payer: 59 | Admitting: Family

## 2021-08-24 ENCOUNTER — Other Ambulatory Visit (INDEPENDENT_AMBULATORY_CARE_PROVIDER_SITE_OTHER): Payer: Self-pay | Admitting: Family

## 2021-08-24 DIAGNOSIS — E039 Hypothyroidism, unspecified: Secondary | ICD-10-CM

## 2021-08-24 MED ORDER — LEVOTHYROXINE SODIUM 25 MCG PO TABS
ORAL_TABLET | Freq: Every day | ORAL | 1 refills | Status: DC
  Filled 2021-08-24: qty 90, 90d supply, fill #0
  Filled 2021-11-26: qty 90, 90d supply, fill #1

## 2021-08-31 ENCOUNTER — Other Ambulatory Visit (HOSPITAL_COMMUNITY): Payer: Self-pay

## 2021-11-27 ENCOUNTER — Other Ambulatory Visit (HOSPITAL_BASED_OUTPATIENT_CLINIC_OR_DEPARTMENT_OTHER): Payer: Self-pay

## 2021-11-27 ENCOUNTER — Other Ambulatory Visit (HOSPITAL_COMMUNITY): Payer: Self-pay

## 2021-11-27 DIAGNOSIS — R7309 Other abnormal glucose: Secondary | ICD-10-CM | POA: Diagnosis not present

## 2021-11-27 DIAGNOSIS — E039 Hypothyroidism, unspecified: Secondary | ICD-10-CM | POA: Diagnosis not present

## 2021-11-27 DIAGNOSIS — Z0000 Encounter for general adult medical examination without abnormal findings: Secondary | ICD-10-CM | POA: Diagnosis not present

## 2022-02-26 ENCOUNTER — Other Ambulatory Visit (HOSPITAL_COMMUNITY): Payer: Self-pay

## 2022-02-26 ENCOUNTER — Other Ambulatory Visit (INDEPENDENT_AMBULATORY_CARE_PROVIDER_SITE_OTHER): Payer: Self-pay | Admitting: Family

## 2022-02-26 DIAGNOSIS — E039 Hypothyroidism, unspecified: Secondary | ICD-10-CM

## 2022-02-27 ENCOUNTER — Other Ambulatory Visit (HOSPITAL_COMMUNITY): Payer: Self-pay

## 2022-03-01 ENCOUNTER — Other Ambulatory Visit (HOSPITAL_COMMUNITY): Payer: Self-pay

## 2022-03-01 ENCOUNTER — Other Ambulatory Visit (INDEPENDENT_AMBULATORY_CARE_PROVIDER_SITE_OTHER): Payer: Self-pay | Admitting: Family

## 2022-03-01 ENCOUNTER — Other Ambulatory Visit (HOSPITAL_BASED_OUTPATIENT_CLINIC_OR_DEPARTMENT_OTHER): Payer: Self-pay

## 2022-03-01 DIAGNOSIS — E039 Hypothyroidism, unspecified: Secondary | ICD-10-CM

## 2022-03-01 MED ORDER — LEVOTHYROXINE SODIUM 25 MCG PO TABS
ORAL_TABLET | Freq: Every day | ORAL | 0 refills | Status: AC
  Filled 2022-03-01: qty 30, fill #0
  Filled 2022-05-24: qty 30, 30d supply, fill #0

## 2022-03-01 MED ORDER — LEVOTHYROXINE SODIUM 25 MCG PO TABS
ORAL_TABLET | ORAL | 0 refills | Status: DC
  Filled 2022-03-01 – 2022-03-02 (×2): qty 90, 90d supply, fill #0

## 2022-03-02 ENCOUNTER — Other Ambulatory Visit (HOSPITAL_BASED_OUTPATIENT_CLINIC_OR_DEPARTMENT_OTHER): Payer: Self-pay

## 2022-05-24 ENCOUNTER — Other Ambulatory Visit (HOSPITAL_COMMUNITY): Payer: Self-pay

## 2022-05-31 DIAGNOSIS — Z23 Encounter for immunization: Secondary | ICD-10-CM | POA: Diagnosis not present

## 2022-06-23 ENCOUNTER — Other Ambulatory Visit (HOSPITAL_COMMUNITY): Payer: Self-pay

## 2022-06-25 ENCOUNTER — Other Ambulatory Visit (HOSPITAL_COMMUNITY): Payer: Self-pay

## 2022-06-25 MED ORDER — LEVOTHYROXINE SODIUM 25 MCG PO TABS
25.0000 ug | ORAL_TABLET | ORAL | 1 refills | Status: DC
  Filled 2022-06-25: qty 90, 90d supply, fill #0
  Filled 2022-09-26: qty 90, 90d supply, fill #1

## 2022-07-09 DIAGNOSIS — H5213 Myopia, bilateral: Secondary | ICD-10-CM | POA: Diagnosis not present

## 2022-07-09 DIAGNOSIS — H52223 Regular astigmatism, bilateral: Secondary | ICD-10-CM | POA: Diagnosis not present

## 2022-09-04 DIAGNOSIS — R059 Cough, unspecified: Secondary | ICD-10-CM | POA: Diagnosis not present

## 2022-09-04 DIAGNOSIS — R0982 Postnasal drip: Secondary | ICD-10-CM | POA: Diagnosis not present

## 2022-09-04 DIAGNOSIS — R0981 Nasal congestion: Secondary | ICD-10-CM | POA: Diagnosis not present

## 2022-09-04 DIAGNOSIS — J019 Acute sinusitis, unspecified: Secondary | ICD-10-CM | POA: Diagnosis not present

## 2022-09-27 ENCOUNTER — Other Ambulatory Visit (HOSPITAL_COMMUNITY): Payer: Self-pay

## 2022-09-27 ENCOUNTER — Other Ambulatory Visit: Payer: Self-pay

## 2022-12-24 ENCOUNTER — Other Ambulatory Visit (HOSPITAL_COMMUNITY): Payer: Self-pay

## 2022-12-25 ENCOUNTER — Other Ambulatory Visit (HOSPITAL_COMMUNITY): Payer: Self-pay

## 2022-12-25 MED ORDER — LEVOTHYROXINE SODIUM 25 MCG PO TABS
25.0000 ug | ORAL_TABLET | Freq: Every morning | ORAL | 0 refills | Status: AC
  Filled 2022-12-25: qty 90, 90d supply, fill #0

## 2023-01-21 NOTE — Progress Notes (Deleted)
     HPI: FU pulmonic stenosis, prior ASD and PDA closure.  Last echocardiogram performed at [facility] in 2017 showed normal LV function, mild pulmonic stenosis, mild pulmonic insufficiency, normal RV size and systolic function.  Last echocardiogram August 2021 showed normal LV function, peak velocity across the pulmonic valve of 1.9 m/s, mean gradient 8 mmHg; no residual ASD or PDA noted.  Since last seen,   Current Outpatient Medications  Medication Sig Dispense Refill   fexofenadine (ALLEGRA) 30 MG tablet Take 30 mg by mouth 2 (two) times daily.     levothyroxine (SYNTHROID) 25 MCG tablet TAKE 1 TABLET BY MOUTH ONCE DAILY 30 tablet 0   levothyroxine (SYNTHROID) 25 MCG tablet Take 1 tablet (25 mcg total) by mouth every morning on an empty stomach. 90 tablet 0   No current facility-administered medications for this visit.     Past Medical History:  Diagnosis Date   [name]-Chiari malformation, type I (HCC)    Growth hormone deficiency (HCC)    Premature infant    Pulmonary stenosis    Spontaneous ASD closure    Spontaneous PDA closure     Past Surgical History:  Procedure Laterality Date   CIRCUMCISION      Social History   Socioeconomic History   Marital status: Single    Spouse name: Not on file   Number of children: Not on file   Years of education: Not on file   Highest education level: Not on file  Occupational History   Not on file  Tobacco Use   Smoking status: Never   Smokeless tobacco: Never  Vaping Use   Vaping Use: Never used  Substance and Sexual Activity   Alcohol use: Not on file   Drug use: Not on file   Sexual activity: Not on file  Other Topics Concern   Not on file  Social History Narrative   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    Social Determinants of Health   Financial Resource Strain: Not on file  Food Insecurity: Not on file  Transportation Needs: Not on file  Physical Activity: Not on file  Stress: Not on file  Social  Connections: Not on file  Intimate Partner Violence: Not on file    Family History  Problem Relation Age of Onset   Hypertension Paternal Grandfather    Hypertension Maternal Grandmother    Growth hormone deficiency Brother     ROS: no fevers or chills, productive cough, hemoptysis, dysphasia, odynophagia, melena, hematochezia, dysuria, hematuria, rash, seizure activity, orthopnea, PND, pedal edema, claudication. Remaining systems are negative.  Physical Exam: Well-developed well-nourished in no acute distress.  Skin is warm and dry.  HEENT is normal.  Neck is supple.  Chest is clear to auscultation with normal expansion.  Cardiovascular exam is regular rate and rhythm.  Abdominal exam nontender or distended. No masses palpated. Extremities show no edema. neuro grossly intact  ECG- personally reviewed  A/P  1 history of pulmonic stenosis-we will plan to repeat echocardiogram.  Most recent study showed no significant residual gradients.  2 history of ASD and PDA spontaneous closure?-Follow-up echocardiogram to reassess.  Olga Millers, MD

## 2023-01-29 ENCOUNTER — Ambulatory Visit: Payer: 59 | Attending: Cardiology | Admitting: Cardiology

## 2023-01-30 ENCOUNTER — Encounter: Payer: Self-pay | Admitting: Cardiology

## 2023-03-19 ENCOUNTER — Other Ambulatory Visit (HOSPITAL_COMMUNITY): Payer: Self-pay

## 2023-03-19 NOTE — Progress Notes (Signed)
HPI: FU pulmonic stenosis, prior ASD and PDA closure. Follow-up echocardiogram August 2021 showed no residual PDA or ASD, normal LV function and no significant pulmonic stenosis.  Since last seen patient denies dyspnea on exertion, exertional chest pain or syncope.  No palpitations.  Current Outpatient Medications  Medication Sig Dispense Refill   fexofenadine (ALLEGRA) 30 MG tablet Take 30 mg by mouth 2 (two) times daily.     levothyroxine (SYNTHROID) 25 MCG tablet Take 1 tablet (25 mcg total) by mouth every morning on an empty stomach 90 tablet 0   levothyroxine (SYNTHROID) 25 MCG tablet TAKE 1 TABLET BY MOUTH ONCE DAILY 30 tablet 0   levothyroxine (SYNTHROID) 25 MCG tablet Take 1 tablet (25 mcg total) by mouth every morning on an empty stomach. (Patient not taking: Reported on 03/28/2023) 90 tablet 0   No current facility-administered medications for this visit.     Past Medical History:  Diagnosis Date   [name]-Chiari malformation, type I (HCC)    Growth hormone deficiency (HCC)    Premature infant    Pulmonary stenosis    Spontaneous ASD closure    Spontaneous PDA closure     Past Surgical History:  Procedure Laterality Date   CIRCUMCISION      Social History   Socioeconomic History   Marital status: Single    Spouse name: Not on file   Number of children: Not on file   Years of education: Not on file   Highest education level: Not on file  Occupational History   Not on file  Tobacco Use   Smoking status: Never   Smokeless tobacco: Never  Vaping Use   Vaping status: Never Used  Substance and Sexual Activity   Alcohol use: Not on file   Drug use: Not on file   Sexual activity: Not on file  Other Topics Concern   Not on file  Social History Narrative   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    Social Determinants of Health   Financial Resource Strain: Not on file  Food Insecurity: Not on file  Transportation Needs: Not on file  Physical  Activity: Not on file  Stress: Not on file  Social Connections: Not on file  Intimate Partner Violence: Not on file    Family History  Problem Relation Age of Onset   Hypertension Paternal Grandfather    Hypertension Maternal Grandmother    Growth hormone deficiency Brother     ROS: no fevers or chills, productive cough, hemoptysis, dysphasia, odynophagia, melena, hematochezia, dysuria, hematuria, rash, seizure activity, orthopnea, PND, pedal edema, claudication. Remaining systems are negative.  Physical Exam: Well-developed well-nourished in no acute distress.  Skin is warm and dry.  HEENT is normal.  Neck is supple.  Chest is clear to auscultation with normal expansion.  Cardiovascular exam is regular rate and rhythm.  Abdominal exam nontender or distended. No masses palpated. Extremities show no edema. neuro grossly intact  EKG Interpretation Date/Time:  Thursday March 28 2023 08:34:39 EDT Ventricular Rate:  102 PR Interval:  138 QRS Duration:  86 QT Interval:  332 QTC Calculation: 432 R Axis:   76  Text Interpretation: Sinus tachycardia T wave abnormality, consider inferior ischemia When compared with ECG of July 08, 2001 10:04, PREVIOUS ECG IS PRESENT Confirmed by Olga Millers ([id number]) on 03/28/2023 8:36:11 AM    A/P  1 history of pulmonic stenosis-most recent echocardiogram showed improvement in gradients.  Will repeat study.  Patient is asymptomatic.  2 history  of ASD/PDA spontaneous closure-no evidence of residual defects on last echocardiogram.  Will repeat.  Olga Millers, MD

## 2023-03-20 ENCOUNTER — Other Ambulatory Visit: Payer: Self-pay

## 2023-03-20 ENCOUNTER — Other Ambulatory Visit (HOSPITAL_COMMUNITY): Payer: Self-pay

## 2023-03-20 MED ORDER — LEVOTHYROXINE SODIUM 25 MCG PO TABS
25.0000 ug | ORAL_TABLET | Freq: Every morning | ORAL | 0 refills | Status: DC
  Filled 2023-03-20: qty 90, 90d supply, fill #0

## 2023-03-21 ENCOUNTER — Other Ambulatory Visit (HOSPITAL_COMMUNITY): Payer: Self-pay

## 2023-03-21 ENCOUNTER — Other Ambulatory Visit: Payer: Self-pay

## 2023-03-22 ENCOUNTER — Other Ambulatory Visit: Payer: Self-pay

## 2023-03-28 ENCOUNTER — Encounter: Payer: Self-pay | Admitting: Cardiology

## 2023-03-28 ENCOUNTER — Ambulatory Visit: Payer: 59 | Attending: Cardiology | Admitting: Cardiology

## 2023-03-28 VITALS — BP 102/78 | HR 102 | Ht 68.0 in | Wt 169.2 lb

## 2023-03-28 DIAGNOSIS — Q211 Atrial septal defect, unspecified: Secondary | ICD-10-CM | POA: Diagnosis not present

## 2023-03-28 DIAGNOSIS — Q250 Patent ductus arteriosus: Secondary | ICD-10-CM

## 2023-03-28 DIAGNOSIS — I370 Nonrheumatic pulmonary valve stenosis: Secondary | ICD-10-CM

## 2023-03-28 NOTE — Patient Instructions (Signed)
Medication Instructions:  Your physician recommends that you continue on your current medications as directed. Please refer to the Current Medication list given to you today.   *If you need a refill on your cardiac medications before your next appointment, please call your pharmacy*    Testing/Procedures: Echo will be scheduled at [address] [facility] [address].  Your physician has requested that you have an echocardiogram. Echocardiography is a painless test that uses sound waves to create images of your heart. It provides your doctor with information about the size and shape of your heart and how well your heart's chambers and valves are working. This procedure takes approximately one hour. There are no restrictions for this procedure. Please do NOT wear cologne, perfume, aftershave, or lotions (deodorant is allowed). Please arrive 15 minutes prior to your appointment time.    Follow-Up: At [facility], you and your health needs are our priority.  As part of our continuing mission to provide you with exceptional heart care, we have created designated Provider Care Teams.  These Care Teams include your primary Cardiologist (physician) and Advanced Practice Providers (APPs -  Physician Assistants and Nurse Practitioners) who all work together to provide you with the care you need, when you need it.  We recommend signing up for the patient portal called "MyChart".  Sign up information is provided on this After Visit Summary.  MyChart is used to connect with patients for Virtual Visits (Telemedicine).  Patients are able to view lab/test results, encounter notes, upcoming appointments, etc.  Non-urgent messages can be sent to your provider as well.   To learn more about what you can do with MyChart, go to [email or web address].    Your next appointment:   1 year(s)  The format for your next appointment:   In Person  Provider:   Olga Millers, MD

## 2023-04-10 ENCOUNTER — Other Ambulatory Visit (HOSPITAL_BASED_OUTPATIENT_CLINIC_OR_DEPARTMENT_OTHER): Payer: Self-pay

## 2023-04-10 DIAGNOSIS — Z0000 Encounter for general adult medical examination without abnormal findings: Secondary | ICD-10-CM | POA: Diagnosis not present

## 2023-04-10 DIAGNOSIS — Z131 Encounter for screening for diabetes mellitus: Secondary | ICD-10-CM | POA: Diagnosis not present

## 2023-04-10 DIAGNOSIS — E039 Hypothyroidism, unspecified: Secondary | ICD-10-CM | POA: Diagnosis not present

## 2023-04-10 DIAGNOSIS — Z6825 Body mass index (BMI) 25.0-25.9, adult: Secondary | ICD-10-CM | POA: Diagnosis not present

## 2023-04-10 MED ORDER — LEVOTHYROXINE SODIUM 25 MCG PO TABS
25.0000 ug | ORAL_TABLET | Freq: Every morning | ORAL | 3 refills | Status: DC
  Filled 2023-06-23 – 2023-06-24 (×2): qty 90, 90d supply, fill #0
  Filled 2023-09-22 – 2023-09-23 (×2): qty 90, 90d supply, fill #1
  Filled 2023-12-17: qty 90, 90d supply, fill #2
  Filled 2023-12-18: qty 90, 90d supply, fill #0
  Filled 2024-03-17: qty 90, 90d supply, fill #1

## 2023-05-02 ENCOUNTER — Ambulatory Visit (HOSPITAL_COMMUNITY): Payer: 59 | Attending: Cardiology

## 2023-05-02 DIAGNOSIS — I370 Nonrheumatic pulmonary valve stenosis: Secondary | ICD-10-CM

## 2023-05-02 LAB — ECHOCARDIOGRAM COMPLETE
Area-P 1/2: 4.73 cm2
S' Lateral: 3.1 cm

## 2023-06-23 ENCOUNTER — Other Ambulatory Visit (HOSPITAL_BASED_OUTPATIENT_CLINIC_OR_DEPARTMENT_OTHER): Payer: Self-pay

## 2023-06-24 ENCOUNTER — Other Ambulatory Visit: Payer: Self-pay

## 2023-06-24 ENCOUNTER — Other Ambulatory Visit (HOSPITAL_COMMUNITY): Payer: Self-pay

## 2023-09-23 ENCOUNTER — Other Ambulatory Visit: Payer: Self-pay

## 2023-09-23 ENCOUNTER — Other Ambulatory Visit (HOSPITAL_COMMUNITY): Payer: Self-pay

## 2023-09-24 ENCOUNTER — Other Ambulatory Visit (HOSPITAL_COMMUNITY): Payer: Self-pay

## 2023-10-29 ENCOUNTER — Encounter (INDEPENDENT_AMBULATORY_CARE_PROVIDER_SITE_OTHER): Payer: Self-pay

## 2023-11-11 ENCOUNTER — Encounter (INDEPENDENT_AMBULATORY_CARE_PROVIDER_SITE_OTHER): Payer: Self-pay

## 2023-11-21 ENCOUNTER — Other Ambulatory Visit: Payer: Self-pay

## 2023-11-21 ENCOUNTER — Other Ambulatory Visit (HOSPITAL_BASED_OUTPATIENT_CLINIC_OR_DEPARTMENT_OTHER): Payer: Self-pay

## 2023-11-21 DIAGNOSIS — Z6825 Body mass index (BMI) 25.0-25.9, adult: Secondary | ICD-10-CM | POA: Diagnosis not present

## 2023-11-21 DIAGNOSIS — Z1283 Encounter for screening for malignant neoplasm of skin: Secondary | ICD-10-CM | POA: Diagnosis not present

## 2023-11-21 DIAGNOSIS — L309 Dermatitis, unspecified: Secondary | ICD-10-CM | POA: Diagnosis not present

## 2023-11-21 MED ORDER — BETAMETHASONE DIPROPIONATE 0.05 % EX CREA
1.0000 | TOPICAL_CREAM | Freq: Two times a day (BID) | CUTANEOUS | 6 refills | Status: AC | PRN
  Filled 2023-11-21: qty 45, 30d supply, fill #0

## 2023-12-18 ENCOUNTER — Other Ambulatory Visit: Payer: Self-pay

## 2023-12-18 ENCOUNTER — Other Ambulatory Visit (HOSPITAL_BASED_OUTPATIENT_CLINIC_OR_DEPARTMENT_OTHER): Payer: Self-pay

## 2023-12-18 ENCOUNTER — Other Ambulatory Visit (HOSPITAL_COMMUNITY): Payer: Self-pay

## 2024-03-17 ENCOUNTER — Other Ambulatory Visit: Payer: Self-pay

## 2024-04-02 ENCOUNTER — Encounter: Payer: Self-pay | Admitting: Cardiology

## 2024-04-13 DIAGNOSIS — Z6824 Body mass index (BMI) 24.0-24.9, adult: Secondary | ICD-10-CM | POA: Diagnosis not present

## 2024-04-13 DIAGNOSIS — Z0000 Encounter for general adult medical examination without abnormal findings: Secondary | ICD-10-CM | POA: Diagnosis not present

## 2024-04-13 DIAGNOSIS — E039 Hypothyroidism, unspecified: Secondary | ICD-10-CM | POA: Diagnosis not present

## 2024-06-15 ENCOUNTER — Other Ambulatory Visit (HOSPITAL_COMMUNITY): Payer: Self-pay

## 2024-06-17 ENCOUNTER — Other Ambulatory Visit: Payer: Self-pay

## 2024-06-17 ENCOUNTER — Other Ambulatory Visit (HOSPITAL_BASED_OUTPATIENT_CLINIC_OR_DEPARTMENT_OTHER): Payer: Self-pay

## 2024-06-17 ENCOUNTER — Other Ambulatory Visit (HOSPITAL_COMMUNITY): Payer: Self-pay

## 2024-06-17 MED ORDER — LEVOTHYROXINE SODIUM 25 MCG PO TABS
25.0000 ug | ORAL_TABLET | Freq: Every morning | ORAL | 3 refills | Status: AC
  Filled 2024-06-17 (×2): qty 90, 90d supply, fill #0
